# Patient Record
Sex: Female | Born: 1975 | Race: White | Hispanic: No | Marital: Married | Smoking: Former smoker
Health system: Southern US, Community
[De-identification: ages and names within clinical notes are randomized; demographics above are authoritative.]

## PROBLEM LIST (undated history)

## (undated) DIAGNOSIS — F32A Depression, unspecified: Secondary | ICD-10-CM

## (undated) DIAGNOSIS — D649 Anemia, unspecified: Secondary | ICD-10-CM

## (undated) DIAGNOSIS — F419 Anxiety disorder, unspecified: Secondary | ICD-10-CM

## (undated) DIAGNOSIS — F329 Major depressive disorder, single episode, unspecified: Secondary | ICD-10-CM

## (undated) DIAGNOSIS — R519 Headache, unspecified: Secondary | ICD-10-CM

## (undated) DIAGNOSIS — R51 Headache: Secondary | ICD-10-CM

## (undated) DIAGNOSIS — G4700 Insomnia, unspecified: Secondary | ICD-10-CM

## (undated) HISTORY — PX: ABDOMINAL HYSTERECTOMY: SHX81

## (undated) HISTORY — PX: TUBAL LIGATION: SHX77

## (undated) HISTORY — PX: GASTRIC BYPASS: SHX52

## (undated) HISTORY — PX: CHOLECYSTECTOMY: SHX55

## (undated) HISTORY — PX: DILATION AND CURETTAGE OF UTERUS: SHX78

---

## 2007-07-22 ENCOUNTER — Emergency Department: Payer: Self-pay | Admitting: Emergency Medicine

## 2007-07-22 ENCOUNTER — Other Ambulatory Visit: Payer: Self-pay

## 2007-07-24 ENCOUNTER — Ambulatory Visit: Payer: Self-pay | Admitting: Obstetrics and Gynecology

## 2007-07-28 ENCOUNTER — Ambulatory Visit: Payer: Self-pay | Admitting: Obstetrics and Gynecology

## 2008-05-08 ENCOUNTER — Emergency Department: Payer: Self-pay | Admitting: Emergency Medicine

## 2010-03-13 ENCOUNTER — Encounter: Payer: Self-pay | Admitting: Obstetrics & Gynecology

## 2010-04-10 ENCOUNTER — Encounter: Payer: Self-pay | Admitting: Maternal and Fetal Medicine

## 2010-05-15 ENCOUNTER — Inpatient Hospital Stay: Payer: Self-pay | Admitting: Obstetrics and Gynecology

## 2010-06-08 ENCOUNTER — Encounter: Payer: Self-pay | Admitting: Obstetrics and Gynecology

## 2011-02-12 ENCOUNTER — Encounter: Payer: Self-pay | Admitting: Obstetrics and Gynecology

## 2011-03-29 ENCOUNTER — Emergency Department: Payer: Self-pay | Admitting: Emergency Medicine

## 2011-05-29 ENCOUNTER — Observation Stay: Payer: Self-pay | Admitting: Obstetrics and Gynecology

## 2017-08-27 NOTE — H&P (Signed)
HPI: AUB: TVUS wnl- sx anemia requiring iron infusions. Is now on po iron. 3 c/s - minimal adhesions noted at last C/S - op note reviewed. Plan for TLH-->possible TAH.  Workup:  Pap: 8/18 neg with neg HPV  EMBx: ENDOMETRIUM, BIOPSY:  PROLIFERATIVE ENDOMETRIUM. NO HYPERPLASIA OR CARCINOMA.  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  Past Surgical History:  has a past surgical history that includes Gastric bypass; Dilation and curettage of uterus; Cesarean section (2005/2012); Cholecystectomy (02/2004); Tubal ligation (09/10/14); and CESAREAN DELIVERY ONLY; INCLUDING POSTPARTUM CARE (N/A, 09/10/2014). Family History: family history includes Alzheimer's disease in her maternal grandfather and maternal grandmother; Anxiety in her sister; Bipolar disorder in her sister; Coronary Artery Disease (Blocked arteries around heart) in her father; Depression in her sister; Diabetes in her brother and father; Diabetes type II in her father; Genetic problem/disorder in her daughter; High blood pressure (Hypertension) in her father; Hyperlipidemia (Elevated cholesterol) in her father; Obesity in her father and sister; Thyroid disease in her father and mother. Social History:  reports that she quit smoking about 11 years ago. She has a 30.00 pack-year smoking history. She has never used smokeless tobacco. She reports that she does not drink alcohol or use  drugs. OB/GYN History:          OB History    Gravida  5   Para  4   Term  4   Preterm  0   AB  1   Living  4     SAB  1   TAB  0   Ectopic  0   Molar      Multiple  0   Live Births  4          Allergies: is allergic to magnesium sulfate; nsaids (non-steroidal anti-inflammatory drug); bactrim [sulfamethoxazole-trimethoprim]; lexapro [escitalopram oxalate]; paxil [paroxetine hcl]; prozac [fluoxetine]; seroquel [quetiapine]; trazodone; and wellbutrin [bupropion hcl]. Medications:  Current Outpatient Medications:  .  clonazePAM (KLONOPIN) 0.5 MG tablet, Take 1 tablet (0.5 mg total) by mouth once daily as needed for Anxiety., Disp: 30 tablet, Rfl: 2 .  ferrous sulfate 324 mg (65 mg iron) EC tablet, Take 1 tablet (324 mg total) by mouth daily with breakfast., Disp: 30 each, Rfl: 0 .  fluticasone (FLONASE) 50 mcg/actuation nasal spray, Place 2 sprays into both nostrils once daily., Disp: 16 g, Rfl: 2 .  multivitamin (THERAGRAN) tablet, 1 tab by mouth daily, Disp: , Rfl:  .  promethazine (PHENERGAN) 12.5 MG tablet, Take 1-2 tablets every 6 hours as needed., Disp: 30 tablet, Rfl: 1 .  SUMAtriptan (IMITREX) 100 MG tablet, TAKE 1 TABLET BY MOUTH ONCE AS NEEDED FOR MIGRAINE FOR UP TO 1 DOSE. MAY REPEAT ONCE AFTER 2 HOURS, Disp: 9 tablet, Rfl: 11 .  topiramate (TOPAMAX) 50 MG tablet, Take 1 tablet (50 mg total) by mouth 2 (two) times daily., Disp: 60 tablet, Rfl: 11 .  traZODone (DESYREL) 50 MG tablet, Take 1 tablet (50 mg total) by mouth nightly as needed for Sleep., Disp: 90 tablet, Rfl: 3  Review of Systems: No SOB, no palpitations or chest pain, no new lower extremity edema, no nausea or vomiting or bowel or bladder complaints. See HPI for gyn specific ROS.   Exam:   BP 99/71   Pulse 76   Wt 94.2 kg (207 lb 9.6 oz)   BMI 30.66 kg/m   General: Patient is well-groomed, well-nourished, appears stated age in no acute distress  HEENT: head is atraumatic  and normocephalic, trachea is midline, neck is supple with no palpable nodules  CV: Regular rhythm and normal heart rate, no murmur  Pulm: Clear to auscultation throughout lung fields with no wheezing, crackles, or rhonchi. No increased work of breathing  Abdomen: soft , no mass, non-tender, no rebound tenderness, no hepatomegaly  Pelvic: tanner stage 5 ,              External genitalia: vulva /labia no lesions             Urethra: no prolapse             Vagina: normal physiologic d/c, laxity in vaginal walls             Cervix: no lesions, no cervical motion tenderness, good descent             Uterus: normal size shape and contour, non-tender             Adnexa: no mass,  non-tender               Rectovaginal: External wnl   Impression:   The encounter diagnosis was Menorrhagia with irregular cycle.    Plan:    Patient returns for a preoperative discussion regarding her plans to proceed with surgical treatment of her AUB by total laparoscopic hysterectomy with bilateral salpingectomy procedure. Because of her extensive surgical hx, we may need to proceed with TAH.   We will perform a cystoscopy to evaluate the urinary tract after the procedure.   The patient and I discussed the technical aspects of the procedure including the potential for risks and complications. These include but are not limited to the risk of infection requiring post-operative antibiotics or further procedures. We talked about the risk of injury to adjacent organs including bladder, bowel, ureter, blood vessels or nerves. We talked about the need to convert to an open incision. We talked about the possible need for blood transfusion. We talked aboutpostop complications such asthromboembolic or cardiopulmonary complications. All of her questions were answered.  Her preoperative exam was completed and the appropriate consents were signed. She is scheduled to undergo this procedure in the near  future.  Specific Peri-operative Considerations:  - Consent: obtained today - Health Maintenance: up to date - Labs: CBC, CMP preoperatively - Studies: EKG, CXR preoperatively - Bowel Preparation: None required - Abx:  Cefoxitin 2g - VTE ppx: SCDs perioperatively - Glucose Protocol: n/a - Beta-blockade: n/a

## 2017-08-28 ENCOUNTER — Encounter
Admission: RE | Admit: 2017-08-28 | Discharge: 2017-08-28 | Disposition: A | Discharge status: Home / Self Care | Payer: Managed Care, Other (non HMO) | Source: Ambulatory Visit | Attending: Obstetrics and Gynecology | Admitting: Obstetrics and Gynecology

## 2017-08-28 DIAGNOSIS — Z9889 Other specified postprocedural states: Secondary | ICD-10-CM | POA: Insufficient documentation

## 2017-08-28 DIAGNOSIS — Z8349 Family history of other endocrine, nutritional and metabolic diseases: Secondary | ICD-10-CM | POA: Insufficient documentation

## 2017-08-28 DIAGNOSIS — Z886 Allergy status to analgesic agent status: Secondary | ICD-10-CM | POA: Diagnosis not present

## 2017-08-28 DIAGNOSIS — Z79899 Other long term (current) drug therapy: Secondary | ICD-10-CM | POA: Diagnosis not present

## 2017-08-28 DIAGNOSIS — E669 Obesity, unspecified: Secondary | ICD-10-CM | POA: Diagnosis not present

## 2017-08-28 DIAGNOSIS — F329 Major depressive disorder, single episode, unspecified: Secondary | ICD-10-CM | POA: Diagnosis not present

## 2017-08-28 DIAGNOSIS — N920 Excessive and frequent menstruation with regular cycle: Secondary | ICD-10-CM | POA: Diagnosis not present

## 2017-08-28 DIAGNOSIS — Z820 Family history of epilepsy and other diseases of the nervous system: Secondary | ICD-10-CM | POA: Insufficient documentation

## 2017-08-28 DIAGNOSIS — Z818 Family history of other mental and behavioral disorders: Secondary | ICD-10-CM | POA: Diagnosis not present

## 2017-08-28 DIAGNOSIS — D649 Anemia, unspecified: Secondary | ICD-10-CM | POA: Insufficient documentation

## 2017-08-28 DIAGNOSIS — Z87891 Personal history of nicotine dependence: Secondary | ICD-10-CM | POA: Diagnosis not present

## 2017-08-28 DIAGNOSIS — F419 Anxiety disorder, unspecified: Secondary | ICD-10-CM | POA: Diagnosis not present

## 2017-08-28 DIAGNOSIS — Z01812 Encounter for preprocedural laboratory examination: Secondary | ICD-10-CM | POA: Insufficient documentation

## 2017-08-28 DIAGNOSIS — I499 Cardiac arrhythmia, unspecified: Secondary | ICD-10-CM | POA: Diagnosis not present

## 2017-08-28 DIAGNOSIS — Z8249 Family history of ischemic heart disease and other diseases of the circulatory system: Secondary | ICD-10-CM | POA: Diagnosis not present

## 2017-08-28 DIAGNOSIS — Z9049 Acquired absence of other specified parts of digestive tract: Secondary | ICD-10-CM | POA: Insufficient documentation

## 2017-08-28 DIAGNOSIS — Z888 Allergy status to other drugs, medicaments and biological substances status: Secondary | ICD-10-CM | POA: Insufficient documentation

## 2017-08-28 DIAGNOSIS — G4700 Insomnia, unspecified: Secondary | ICD-10-CM | POA: Insufficient documentation

## 2017-08-28 DIAGNOSIS — Z833 Family history of diabetes mellitus: Secondary | ICD-10-CM | POA: Diagnosis not present

## 2017-08-28 DIAGNOSIS — Z9884 Bariatric surgery status: Secondary | ICD-10-CM | POA: Insufficient documentation

## 2017-08-28 HISTORY — DX: Anxiety disorder, unspecified: F41.9

## 2017-08-28 HISTORY — DX: Headache, unspecified: R51.9

## 2017-08-28 HISTORY — DX: Depression, unspecified: F32.A

## 2017-08-28 HISTORY — DX: Insomnia, unspecified: G47.00

## 2017-08-28 HISTORY — DX: Major depressive disorder, single episode, unspecified: F32.9

## 2017-08-28 HISTORY — DX: Headache: R51

## 2017-08-28 HISTORY — DX: Anemia, unspecified: D64.9

## 2017-08-28 LAB — BASIC METABOLIC PANEL
Anion gap: 6 (ref 5–15)
BUN: 10 mg/dL (ref 6–20)
CHLORIDE: 107 mmol/L (ref 101–111)
CO2: 23 mmol/L (ref 22–32)
Calcium: 9 mg/dL (ref 8.9–10.3)
Creatinine, Ser: 0.84 mg/dL (ref 0.44–1.00)
GFR calc Af Amer: >60 mL/min (ref >60)
GLUCOSE: 98 mg/dL (ref 65–99)
POTASSIUM: 3.7 mmol/L (ref 3.5–5.1)
Sodium: 136 mmol/L (ref 135–145)

## 2017-08-28 LAB — CBC
HEMATOCRIT: 34.1 % — AB (ref 35.0–47.0)
HEMOGLOBIN: 10.8 g/dL — AB (ref 12.0–16.0)
MCH: 25.2 pg — AB (ref 26.0–34.0)
MCHC: 31.6 g/dL — AB (ref 32.0–36.0)
MCV: 79.6 fL — AB (ref 80.0–100.0)
Platelets: 265 10*3/uL (ref 150–440)
RBC: 4.29 MIL/uL (ref 3.80–5.20)
RDW: 16.5 % — AB (ref 11.5–14.5)
WBC: 5 10*3/uL (ref 3.6–11.0)

## 2017-08-28 LAB — TYPE AND SCREEN
ABO/RH(D): O POS
ANTIBODY SCREEN: NEGATIVE

## 2017-08-28 LAB — SURGICAL PCR SCREEN
MRSA, PCR: NEGATIVE
Staphylococcus aureus: NEGATIVE

## 2017-08-28 NOTE — Pre-Procedure Instructions (Signed)
Pt here for blood drawn, MRSA nasal swab, pre-op instructions and review incentive spirometry, pt demonstrated correct use of same.

## 2017-08-28 NOTE — Patient Instructions (Signed)
Your procedure is scheduled on: 09/02/17 Report to Day Surgery. [facility] To find out your arrival time please call [phone] between 1PM - 3PM on 08/30/17.  Remember: Instructions that are not followed completely may result in serious medical risk, up to and including death, or upon the discretion of your surgeon and anesthesiologist your surgery may need to be rescheduled.     _X__ 1. Do not eat food after midnight the night before your procedure.                 No gum chewing or hard candies. You may drink clear liquids up to 2 hours                 before you are scheduled to arrive for your surgery- DO not drink clear                 liquids within 2 hours of the start of your surgery.                 Clear Liquids include:  water, apple juice without pulp, clear carbohydrate                 drink such as Clearfast of Gartorade, Black Coffee or Tea (Do not add                 anything to coffee or tea).     _X__ 2.  No Alcohol for 24 hours before or after surgery.   _X__ 3.  Do Not Smoke or use e-cigarettes For 24 Hours Prior to Your Surgery.                 Do not use any chewable tobacco products for at least 6 hours prior to                 surgery.  ____  4.  Bring all medications with you on the day of surgery if instructed.   _X___  5.  Notify your doctor if there is any change in your medical condition      (cold, fever, infections).     Do not wear jewelry, make-up, hairpins, clips or nail polish. Do not wear lotions, powders, or perfumes. You may wear deodorant. Do not shave 48 hours prior to surgery. Men may shave face and neck. Do not bring valuables to the hospital.    [facility] is not responsible for any belongings or valuables.  Contacts, dentures or bridgework may not be worn into surgery. Leave your suitcase in the car. After surgery it may be brought to your room. For patients admitted to the hospital, discharge  time is determined by your treatment team.   Patients discharged the day of surgery will not be allowed to drive home.   Please read over the following fact sheets that you were given:   Surgical Site Infection Prevention / MRSA/ SPIROMETRY  _X___ Take these medicines the morning of surgery with A SIP OF WATER:    1.TOPIRAMATE  2.   3.   4.  5.  6.  ____ Fleet Enema (as directed)   _X___ Use CHG Soap as directed  ____ Use inhalers on the day of surgery  ____ Stop metformin 2 days prior to surgery    ____ Take 1/2 of usual insulin dose the night before surgery. No insulin the morning          of surgery.   ____  Stop Coumadin/Plavix/aspirin on  ____ Stop Anti-inflammatories on    ____ Stop supplements until after surgery.    ____ Bring C-Pap to the hospital.   PRACTICE SPIROMETRY AND BRING DAY OF SURGERY

## 2017-09-01 MED ORDER — CEFAZOLIN SODIUM-DEXTROSE 2-4 GM/100ML-% IV SOLN
2.0000 g | INTRAVENOUS | Status: AC
  Administered 2017-09-02: 2 g via INTRAVENOUS

## 2017-09-02 ENCOUNTER — Encounter
Admission: RE | Disposition: A | Discharge status: Home / Self Care | Payer: Self-pay | Source: Ambulatory Visit | Attending: Obstetrics and Gynecology

## 2017-09-02 ENCOUNTER — Ambulatory Visit
Admission: RE | Admit: 2017-09-02 | Discharge: 2017-09-02 | Disposition: A | Discharge status: Home / Self Care | Payer: Managed Care, Other (non HMO) | Source: Ambulatory Visit | Attending: Obstetrics and Gynecology | Admitting: Obstetrics and Gynecology

## 2017-09-02 ENCOUNTER — Ambulatory Visit: Payer: Managed Care, Other (non HMO) | Admitting: Anesthesiology

## 2017-09-02 DIAGNOSIS — F329 Major depressive disorder, single episode, unspecified: Secondary | ICD-10-CM | POA: Diagnosis not present

## 2017-09-02 DIAGNOSIS — N939 Abnormal uterine and vaginal bleeding, unspecified: Secondary | ICD-10-CM | POA: Insufficient documentation

## 2017-09-02 DIAGNOSIS — Z6829 Body mass index (BMI) 29.0-29.9, adult: Secondary | ICD-10-CM | POA: Diagnosis not present

## 2017-09-02 DIAGNOSIS — D509 Iron deficiency anemia, unspecified: Secondary | ICD-10-CM | POA: Insufficient documentation

## 2017-09-02 DIAGNOSIS — Z87891 Personal history of nicotine dependence: Secondary | ICD-10-CM | POA: Insufficient documentation

## 2017-09-02 DIAGNOSIS — N888 Other specified noninflammatory disorders of cervix uteri: Secondary | ICD-10-CM | POA: Insufficient documentation

## 2017-09-02 DIAGNOSIS — G4700 Insomnia, unspecified: Secondary | ICD-10-CM | POA: Insufficient documentation

## 2017-09-02 DIAGNOSIS — N72 Inflammatory disease of cervix uteri: Secondary | ICD-10-CM | POA: Insufficient documentation

## 2017-09-02 DIAGNOSIS — Z9884 Bariatric surgery status: Secondary | ICD-10-CM | POA: Diagnosis not present

## 2017-09-02 DIAGNOSIS — Z79899 Other long term (current) drug therapy: Secondary | ICD-10-CM | POA: Insufficient documentation

## 2017-09-02 DIAGNOSIS — E669 Obesity, unspecified: Secondary | ICD-10-CM | POA: Insufficient documentation

## 2017-09-02 DIAGNOSIS — F419 Anxiety disorder, unspecified: Secondary | ICD-10-CM | POA: Diagnosis not present

## 2017-09-02 HISTORY — PX: LAPAROSCOPIC BILATERAL SALPINGECTOMY: SHX5889

## 2017-09-02 HISTORY — PX: LAPAROSCOPIC HYSTERECTOMY: SHX1926

## 2017-09-02 HISTORY — PX: CYSTOSCOPY: SHX5120

## 2017-09-02 LAB — POCT PREGNANCY, URINE: PREG TEST UR: NEGATIVE

## 2017-09-02 LAB — ABO/RH: ABO/RH(D): O POS

## 2017-09-02 SURGERY — HYSTERECTOMY, TOTAL, LAPAROSCOPIC
Anesthesia: General

## 2017-09-02 MED ORDER — DOCUSATE SODIUM 100 MG PO CAPS: 100.0000 mg | ORAL_CAPSULE | Freq: Two times a day (BID) | ORAL | 0 refills | Status: AC

## 2017-09-02 MED ORDER — FENTANYL CITRATE (PF) 100 MCG/2ML IJ SOLN
INTRAMUSCULAR | Status: AC
  Filled 2017-09-02: qty 2

## 2017-09-02 MED ORDER — FENTANYL CITRATE (PF) 100 MCG/2ML IJ SOLN
INTRAMUSCULAR | Status: DC | PRN
  Administered 2017-09-02 (×2): 50 ug via INTRAVENOUS
  Administered 2017-09-02: 100 ug via INTRAVENOUS

## 2017-09-02 MED ORDER — ONDANSETRON HCL 4 MG/2ML IJ SOLN
INTRAMUSCULAR | Status: AC
  Filled 2017-09-02: qty 2

## 2017-09-02 MED ORDER — FENTANYL CITRATE (PF) 100 MCG/2ML IJ SOLN
25.0000 ug | INTRAMUSCULAR | Status: DC | PRN
  Administered 2017-09-02 (×2): 25 ug via INTRAVENOUS

## 2017-09-02 MED ORDER — BUPIVACAINE HCL 0.5 % IJ SOLN
INTRAMUSCULAR | Status: DC | PRN
  Administered 2017-09-02: 17 mL

## 2017-09-02 MED ORDER — MIDAZOLAM HCL 2 MG/2ML IJ SOLN
INTRAMUSCULAR | Status: AC
  Filled 2017-09-02: qty 2

## 2017-09-02 MED ORDER — PROPOFOL 10 MG/ML IV BOLUS
INTRAVENOUS | Status: DC | PRN
  Administered 2017-09-02: 160 mg via INTRAVENOUS

## 2017-09-02 MED ORDER — DEXAMETHASONE SODIUM PHOSPHATE 10 MG/ML IJ SOLN
INTRAMUSCULAR | Status: AC
  Filled 2017-09-02: qty 1

## 2017-09-02 MED ORDER — METHYLENE BLUE 0.5 % INJ SOLN
INTRAVENOUS | Status: AC
  Filled 2017-09-02: qty 10

## 2017-09-02 MED ORDER — ACETAMINOPHEN 500 MG PO TABS
ORAL_TABLET | ORAL | Status: AC
  Administered 2017-09-02: 1000 mg via ORAL
  Filled 2017-09-02: qty 2

## 2017-09-02 MED ORDER — ROCURONIUM BROMIDE 100 MG/10ML IV SOLN
INTRAVENOUS | Status: DC | PRN
  Administered 2017-09-02: 10 mg via INTRAVENOUS
  Administered 2017-09-02: 50 mg via INTRAVENOUS
  Administered 2017-09-02: 20 mg via INTRAVENOUS
  Administered 2017-09-02: 30 mg via INTRAVENOUS

## 2017-09-02 MED ORDER — ACETAMINOPHEN 500 MG PO TABS
1000.0000 mg | ORAL_TABLET | ORAL | Status: AC
  Administered 2017-09-02: 1000 mg via ORAL

## 2017-09-02 MED ORDER — GABAPENTIN 400 MG PO CAPS
ORAL_CAPSULE | ORAL | Status: AC
  Administered 2017-09-02: 800 mg via ORAL
  Filled 2017-09-02: qty 2

## 2017-09-02 MED ORDER — SEVOFLURANE IN SOLN
RESPIRATORY_TRACT | Status: AC
  Filled 2017-09-02: qty 250

## 2017-09-02 MED ORDER — LACTATED RINGERS IR SOLN
Status: DC | PRN
  Administered 2017-09-02: 250 mL

## 2017-09-02 MED ORDER — CEFAZOLIN SODIUM-DEXTROSE 2-4 GM/100ML-% IV SOLN
INTRAVENOUS | Status: AC
  Filled 2017-09-02: qty 100

## 2017-09-02 MED ORDER — BUPIVACAINE HCL (PF) 0.5 % IJ SOLN
INTRAMUSCULAR | Status: AC
Start: 2017-09-02 — End: ?
  Filled 2017-09-02: qty 30

## 2017-09-02 MED ORDER — ONDANSETRON HCL 4 MG/2ML IJ SOLN
4.0000 mg | Freq: Once | INTRAMUSCULAR | Status: AC | PRN
  Administered 2017-09-02: 4 mg via INTRAVENOUS

## 2017-09-02 MED ORDER — PROPOFOL 10 MG/ML IV BOLUS
INTRAVENOUS | Status: AC
  Filled 2017-09-02: qty 20

## 2017-09-02 MED ORDER — LACTATED RINGERS IV SOLN
INTRAVENOUS | Status: DC
  Administered 2017-09-02 (×3): via INTRAVENOUS

## 2017-09-02 MED ORDER — EPHEDRINE SULFATE 50 MG/ML IJ SOLN
INTRAMUSCULAR | Status: DC | PRN
Start: 2017-09-02 — End: 2017-09-02
  Administered 2017-09-02 (×2): 20 mg via INTRAVENOUS

## 2017-09-02 MED ORDER — ONDANSETRON HCL 4 MG/2ML IJ SOLN
INTRAMUSCULAR | Status: DC | PRN
  Administered 2017-09-02: 4 mg via INTRAVENOUS

## 2017-09-02 MED ORDER — LACTATED RINGERS IV SOLN: INTRAVENOUS | Status: DC

## 2017-09-02 MED ORDER — OXYCODONE HCL 5 MG PO TABS
ORAL_TABLET | ORAL | Status: AC
  Filled 2017-09-02: qty 1

## 2017-09-02 MED ORDER — ROCURONIUM BROMIDE 50 MG/5ML IV SOLN
INTRAVENOUS | Status: AC
  Filled 2017-09-02: qty 1

## 2017-09-02 MED ORDER — SUGAMMADEX SODIUM 500 MG/5ML IV SOLN
INTRAVENOUS | Status: DC | PRN
  Administered 2017-09-02: 200 mg via INTRAVENOUS

## 2017-09-02 MED ORDER — ACETAMINOPHEN 500 MG PO TABS: 1000.0000 mg | ORAL_TABLET | Freq: Four times a day (QID) | ORAL | 0 refills | Status: AC

## 2017-09-02 MED ORDER — OXYCODONE HCL 5 MG PO TABS
5.0000 mg | ORAL_TABLET | Freq: Four times a day (QID) | ORAL | Status: DC | PRN
  Administered 2017-09-02: 5 mg via ORAL
  Filled 2017-09-02 (×2): qty 1

## 2017-09-02 MED ORDER — LIDOCAINE HCL (CARDIAC) 20 MG/ML IV SOLN
INTRAVENOUS | Status: DC | PRN
  Administered 2017-09-02: 50 mg via INTRAVENOUS

## 2017-09-02 MED ORDER — FENTANYL CITRATE (PF) 100 MCG/2ML IJ SOLN
INTRAMUSCULAR | Status: AC
  Administered 2017-09-02: 25 ug via INTRAVENOUS
  Filled 2017-09-02: qty 2

## 2017-09-02 MED ORDER — FAMOTIDINE 20 MG PO TABS
20.0000 mg | ORAL_TABLET | Freq: Once | ORAL | Status: AC
  Administered 2017-09-02: 20 mg via ORAL

## 2017-09-02 MED ORDER — MIDAZOLAM HCL 2 MG/2ML IJ SOLN
INTRAMUSCULAR | Status: DC | PRN
  Administered 2017-09-02: 2 mg via INTRAVENOUS

## 2017-09-02 MED ORDER — GABAPENTIN 800 MG PO TABS: 800.0000 mg | ORAL_TABLET | Freq: Every day | ORAL | 0 refills | Status: AC

## 2017-09-02 MED ORDER — OXYCODONE HCL 5 MG PO CAPS: 5.0000 mg | ORAL_CAPSULE | Freq: Four times a day (QID) | ORAL | 0 refills | Status: AC | PRN

## 2017-09-02 MED ORDER — GABAPENTIN 300 MG PO CAPS
900.0000 mg | ORAL_CAPSULE | ORAL | Status: AC
Start: 2017-09-02 — End: 2017-09-02
  Administered 2017-09-02: 800 mg via ORAL

## 2017-09-02 MED ORDER — FAMOTIDINE 20 MG PO TABS
ORAL_TABLET | ORAL | Status: AC
  Administered 2017-09-02: 20 mg via ORAL
  Filled 2017-09-02: qty 1

## 2017-09-02 MED ORDER — DEXAMETHASONE SODIUM PHOSPHATE 10 MG/ML IJ SOLN
INTRAMUSCULAR | Status: DC | PRN
  Administered 2017-09-02: 10 mg via INTRAVENOUS

## 2017-09-02 SURGICAL SUPPLY — 65 items
BAG URO DRAIN 2000ML W/SPOUT (MISCELLANEOUS) ×4 IMPLANT
BLADE SURG SZ11 CARB STEEL (BLADE) ×4 IMPLANT
CATH FOLEY 2WAY  5CC 16FR (CATHETERS) ×2
CATH ROBINSON RED A/P 16FR (CATHETERS) ×4 IMPLANT
CATH URTH 16FR FL 2W BLN LF (CATHETERS) ×2 IMPLANT
CHLORAPREP W/TINT 26ML (MISCELLANEOUS) ×4 IMPLANT
CLOSURE WOUND 1/4X4 (GAUZE/BANDAGES/DRESSINGS) ×1
CORD MONOPOLAR M/FML 12FT (MISCELLANEOUS) ×4 IMPLANT
COUNTER NEEDLE 20/40 LG (NEEDLE) ×4 IMPLANT
COVER LIGHT HANDLE STERIS (MISCELLANEOUS) ×8 IMPLANT
DEFOGGER SCOPE WARMER CLEARIFY (MISCELLANEOUS) ×4 IMPLANT
DERMABOND ADVANCED (GAUZE/BANDAGES/DRESSINGS) ×2
DERMABOND ADVANCED .7 DNX12 (GAUZE/BANDAGES/DRESSINGS) ×2 IMPLANT
DEVICE SUTURE ENDOST 10MM (ENDOMECHANICALS) ×4 IMPLANT
DRSG TEGADERM 2-3/8X2-3/4 SM (GAUZE/BANDAGES/DRESSINGS) ×12 IMPLANT
GAUZE SPONGE NON-WVN 2X2 STRL (MISCELLANEOUS) ×4 IMPLANT
GLOVE BIO SURGEON STRL SZ7 (GLOVE) ×8 IMPLANT
GLOVE INDICATOR 7.5 STRL GRN (GLOVE) ×4 IMPLANT
GOWN STRL REUS W/ TWL LRG LVL3 (GOWN DISPOSABLE) ×4 IMPLANT
GOWN STRL REUS W/ TWL XL LVL3 (GOWN DISPOSABLE) ×2 IMPLANT
GOWN STRL REUS W/TWL LRG LVL3 (GOWN DISPOSABLE) ×4
GOWN STRL REUS W/TWL XL LVL3 (GOWN DISPOSABLE) ×2
GRASPER SUT TROCAR 14GX15 (MISCELLANEOUS) ×4 IMPLANT
IRRIGATION STRYKERFLOW (MISCELLANEOUS) ×4 IMPLANT
IRRIGATOR STRYKERFLOW (MISCELLANEOUS) ×8
IV LACTATED RINGERS 1000ML (IV SOLUTION) ×4 IMPLANT
IV NS 1000ML (IV SOLUTION) ×2
IV NS 1000ML BAXH (IV SOLUTION) ×2 IMPLANT
KIT PINK PAD W/HEAD ARE REST (MISCELLANEOUS) ×4
KIT PINK PAD W/HEAD ARM REST (MISCELLANEOUS) ×2 IMPLANT
KIT RM TURNOVER CYSTO AR (KITS) ×4 IMPLANT
LABEL OR SOLS (LABEL) ×4 IMPLANT
LIGASURE BLUNT 5MM 37CM (INSTRUMENTS) ×4 IMPLANT
MANIPULATOR VCARE LG CRV RETR (MISCELLANEOUS) IMPLANT
MANIPULATOR VCARE SML CRV RETR (MISCELLANEOUS) ×4 IMPLANT
MANIPULATOR VCARE STD CRV RETR (MISCELLANEOUS) IMPLANT
NEEDLE VERESS 14GA 120MM (NEEDLE) ×4 IMPLANT
NS IRRIG 500ML POUR BTL (IV SOLUTION) ×4 IMPLANT
OCCLUDER COLPOPNEUMO (BALLOONS) ×8 IMPLANT
PACK GYN LAPAROSCOPIC (MISCELLANEOUS) ×4 IMPLANT
PAD OB MATERNITY 4.3X12.25 (PERSONAL CARE ITEMS) ×4 IMPLANT
PAD PREP 24X41 OB/GYN DISP (PERSONAL CARE ITEMS) ×4 IMPLANT
POUCH SPECIMEN RETRIEVAL 10MM (ENDOMECHANICALS) IMPLANT
SCISSORS METZENBAUM CVD 33 (INSTRUMENTS) ×4 IMPLANT
SET CYSTO W/LG BORE CLAMP LF (SET/KITS/TRAYS/PACK) ×4 IMPLANT
SLEEVE ENDOPATH XCEL 5M (ENDOMECHANICALS) ×8 IMPLANT
SPONGE VERSALON 2X2 STRL (MISCELLANEOUS) ×4
STRIP CLOSURE SKIN 1/4X4 (GAUZE/BANDAGES/DRESSINGS) ×3 IMPLANT
SURGILUBE 2OZ TUBE FLIPTOP (MISCELLANEOUS) ×4 IMPLANT
SUT ENDO VLOC 180-0-8IN (SUTURE) ×4 IMPLANT
SUT MNCRL 4-0 (SUTURE) ×2
SUT MNCRL 4-0 27XMFL (SUTURE) ×2
SUT MNCRL AB 4-0 PS2 18 (SUTURE) ×4 IMPLANT
SUT VIC AB 0 CT1 36 (SUTURE) ×8 IMPLANT
SUT VIC AB 2-0 UR6 27 (SUTURE) ×4 IMPLANT
SUT VIC AB 4-0 SH 27 (SUTURE) ×2
SUT VIC AB 4-0 SH 27XANBCTRL (SUTURE) ×2 IMPLANT
SUTURE MNCRL 4-0 27XMF (SUTURE) ×2 IMPLANT
SWABSTK COMLB BENZOIN TINCTURE (MISCELLANEOUS) ×4 IMPLANT
SYR 50ML LL SCALE MARK (SYRINGE) ×4 IMPLANT
SYRINGE 10CC LL (SYRINGE) ×4 IMPLANT
TROCAR ENDO BLADELESS 11MM (ENDOMECHANICALS) ×4 IMPLANT
TROCAR XCEL NON-BLD 5MMX100MML (ENDOMECHANICALS) ×8 IMPLANT
TUBING INSUF HEATED (TUBING) ×4 IMPLANT
TUBING INSUFFLATOR HI FLOW (MISCELLANEOUS) ×4 IMPLANT

## 2017-09-02 NOTE — Discharge Instructions (Signed)
Discharge instructions after  ° total laparoscopic hysterectomy ° °Signs and Symptoms to Report °Call our office at [phone] if you have any of the following. ° °• Fever over 100.4 degrees or higher °• Severe stomach pain not relieved with pain medications °• Bright red bleeding that’s heavier than a period that does not slow with rest °• To go the bathroom a lot (frequency), you can’t hold your urine (urgency), or it hurts when you empty your bladder (urinate) °• Chest pain °• Shortness of breath °• Pain in the calves of your legs °• Severe nausea and vomiting not relieved with anti-nausea medications °• Signs of infection around your wounds, such as redness, hot to touch, swelling, green/yellow drainage (like pus), bad smelling discharge °• Any concerns ° °What You Can Expect after Surgery °• You may see some pink tinged, bloody fluid and bruising around the wound. This is normal. °• You may notice shoulder and neck pain. This is caused by the gas used during surgery to expand your abdomen so your surgeon could get to the uterus easier. °• You may have a sore throat because of the tube in your mouth during general anesthesia. This will go away in 2 to 3 days. °• You may have some stomach cramps. °• You may notice spotting on your panties. °• You may have pain around the incision sites. ° ° °Activities after Your Discharge °Follow these guidelines to help speed your recovery at home: °• Do the coughing and deep breathing as you did in the hospital for 2 weeks. Use the small blue breathing device, called the incentive spirometer for 2 weeks. °• Don’t drive if you are in pain or taking narcotic pain medicine. You may drive when you can safely slam on the brakes, turn the wheel forcefully, and rotate your torso comfortably. This is typically 1-2 weeks. Practice in a parking lot or side street prior to attempting to drive regularly.  °• Ask others to help with household chores for 4 weeks. °• Do not lift anything  heavier that 10 pounds for 4-6 weeks. This includes pets, children, and groceries. °• Don’t do strenuous activities, exercises, or sports like vacuuming, tennis, squash, etc. until your doctor says it is safe to do so. °---Maintain pelvic rest for 8 weeks. This means nothing in the vagina or rectum at all (no douching, tampons, intercourse) for 8 weeks.  °• Walk as you feel able. Rest often since it may take two or three weeks for your energy level to return to normal.  °• You may climb stairs °• Avoid constipation: °  -Eat fruits, vegetables, and whole grains. Eat small meals as your appetite will take time to return to normal. °  -Drink 6 to 8 glasses of water each day unless your doctor has told you to limit your fluids. °  -Use a laxative or stool softener as needed if constipation becomes a problem. You may take Miralax, metamucil, Citrucil, Colace, Senekot, FiberCon, etc. If this does not relieve the constipation, try two tablespoons of Milk Of Magnesia every 8 hours until your bowels move.  °• You may shower. Gently wash the wounds with a mild soap and water. Pat dry. °• Do not get in a hot tub, swimming pool, etc. for 6 weeks. °• Do not use lotions, oils, powders on the wounds. °• Do not douche, use tampons, or have sex until your doctor says it is okay. °• Take your pain medicine when you need it. The medicine may   not work as well if the pain is bad. ° °Take the medicines you were taking before surgery. Other medications you will need are pain medications and possibly constipation and nausea medications (Zofran). AMBULATORY SURGERY  °DISCHARGE INSTRUCTIONS ° ° °1) The drugs that you were given will stay in your system until tomorrow so for the next 24 hours you should not: ° °A) Drive an automobile °B) Make any legal decisions °C) Drink any alcoholic beverage ° ° °2) You may resume regular meals tomorrow.  Today it is better to start with liquids and gradually work up to solid foods. ° °You may eat anything  you prefer, but it is better to start with liquids, then soup and crackers, and gradually work up to solid foods. ° ° °3) Please notify your doctor immediately if you have any unusual bleeding, trouble breathing, redness and pain at the surgery site, drainage, fever, or pain not relieved by medication. ° ° ° °4) Additional Instructions: ° ° ° ° ° ° ° °Please contact your physician with any problems or Same Day Surgery at [phone], Monday through Friday 6 am to 4 pm, or [place] [facility] [place] Main number at [phone]. °

## 2017-09-02 NOTE — Op Note (Signed)
[place] PROCEDURE DATE: 09/02/2017  PREOPERATIVE DIAGNOSIS: Abnormal uterine bleeding POSTOPERATIVE DIAGNOSIS: The same PROCEDURE: Total laparoscopic hysterectomy, bilateral salpingectomy, cystoscopy SURGEON:  Dr. Christeen Douglas ASSISTANT: Dr. Jennell Corner Anesthesiologist:  Anesthesiologist: Naomie Dean, MD; Darleene Cleaver, Gerrit Heck, MD CRNA: Eduardo Osier, CRNA; Mathews Argyle, CRNA  INDICATIONS: 41 y.o. here for definitive surgical management secondary to the indications listed under preoperative diagnoses; please see preoperative note for further details.  Risks of surgery were discussed with the patient including but not limited to: bleeding which may require transfusion or reoperation; infection which may require antibiotics; injury to bowel, bladder, ureters or other surrounding organs; need for additional procedures; thromboembolic phenomenon, incisional problems and other postoperative/anesthesia complications. Written informed consent was obtained.    FINDINGS:  Small, normal uterus tubes and bilateral ovaries. No upper abdominal abnormalities, other than minimal adhesions.   ANESTHESIA:    General INTRAVENOUS FLUIDS:1700  ml ESTIMATED BLOOD LOSS:75 ml URINE OUTPUT: 300 ml   SPECIMENS: Uterus, cervix, bilateral fallopian tubes  COMPLICATIONS: None immediate  PROCEDURE IN DETAIL:  The patient received prophalactic intravenous antibiotics and had sequential compression devices applied to her lower extremities while in the preoperative area.  She was then taken to the operating room where general anesthesia was administered and was found to be adequate.  She was placed in the dorsal lithotomy position, and was prepped and draped in a sterile manner.  A formal time out was performed with all team members present and in agreement.  A V-care uterine manipulator was placed at this time.  A Foley catheter was inserted into her bladder and attached to constant  drainage. Attention was turned to the abdomen and 0.5% Marcaine infused subq. A 5mm umbilical incision was made with the scalpel.  The Optiview 5-mm trocar and sleeve were then advanced without difficulty with the laparoscope under direct visualization into the abdomen.  The abdomen was then insufflated with carbon dioxide gas and adequate pneumoperitoneum was obtained.  A survey of the patient's pelvis and abdomen revealed the findings above.  Bilateral lower quadrant ports (5 mm on the right and 5 mm on the left) were then placed under direct visualization.  The pelvis was then carefully examined.  Attention was turned to the fallopian tubes; these were freed from the underlying mesosalpinx and the uterine attachments using the Ligasure device.  The bilateral round and broad ligaments were then clamped and transected with the Ligasure device.  The uterine artery was then skeletonized and a bladder flap was created.  The ureters were noted to be safely away from the area of dissection.  The bladder was then bluntly dissected off the lower uterine segment.    At this point, attention was turned to the uterine vessels, which were clamped and cauterized using the Ligasure on the left, and then the right. After the uterine blood flow at the level of the internal os was controlled, both arteries were cut with the Ligasure.  Good hemostasis was noted overall.  The uterosacral and cardinal ligaments were clamped, cut and ligated bilaterally .  Attention was then turned to the cervicovaginal junction, and monopolar scissors were used to transect the cervix from the surrounding vagina using the ring of the V-care as a guide. This was done circumferentially allowing total hysterectomy.  The uterus was then removed from the vagina, a 11mm port placed in the LLQ, and the colpotomy closed with Endostitch.  Overall excellent hemostasis was noted.    Attention was returned to the  abdomen.The ureters were reexamined  bilaterally and were pulsating normally. The abdominal pressure was reduced and hemostasis was confirmed.   Cystoscopy showed bilateral ureteral jets.  No stitches were visualized in the bladder during cystoscopy.  All trocars were removed under direct visualization, and the abdomen was desufflated.  All skin incisions were closed with 4-0 Vicryl subcuticular stitches and Dermabond. The patient tolerated the procedures well.  All instruments, needles, and sponge counts were correct x 2. The patient was taken to the recovery room awake, extubated and in stable condition.   Plan for same day discharge.

## 2017-09-02 NOTE — Anesthesia Preprocedure Evaluation (Signed)
Anesthesia Evaluation  Patient identified by MRN, date of birth, ID band Patient awake    Reviewed: Allergy & Precautions, NPO status , Patient's Chart, lab work & pertinent test results  Airway Mallampati: II       Dental  (+) Teeth Intact   Pulmonary former smoker,    breath sounds clear to auscultation       Cardiovascular Exercise Tolerance: Good  Rhythm:Regular Rate:Normal     Neuro/Psych Anxiety Depression    GI/Hepatic negative GI ROS, Neg liver ROS,   Endo/Other  Morbid obesity  Renal/GU negative Renal ROS     Musculoskeletal   Abdominal   Peds negative pediatric ROS (+)  Hematology  (+) anemia ,   Anesthesia Other Findings   Reproductive/Obstetrics                             Anesthesia Physical Anesthesia Plan  ASA: II  Anesthesia Plan: General   Post-op Pain Management:    Induction: Intravenous  PONV Risk Score and Plan: 1 and Ondansetron and Dexamethasone  Airway Management Planned: Oral ETT  Additional Equipment:   Intra-op Plan:   Post-operative Plan: Extubation in OR  Informed Consent: I have reviewed the patients History and Physical, chart, labs and discussed the procedure including the risks, benefits and alternatives for the proposed anesthesia with the patient or authorized representative who has indicated his/her understanding and acceptance.     Plan Discussed with: CRNA  Anesthesia Plan Comments:         Anesthesia Quick Evaluation

## 2017-09-02 NOTE — Anesthesia Procedure Notes (Signed)
Procedure Name: Intubation Date/Time: 09/02/2017 10:40 AM Performed by: UMPNTIR, Rashaun Curl Pre-anesthesia Checklist: Timeout performed, Patient being monitored, Suction available, Emergency Drugs available and Patient identified Patient Re-evaluated:Patient Re-evaluated prior to induction Oxygen Delivery Method: Circle system utilized Preoxygenation: Pre-oxygenation with 100% oxygen Induction Type: IV induction Ventilation: Mask ventilation without difficulty Laryngoscope Size: Mac and 3 Grade View: Grade I Tube type: Oral Tube size: 7.0 mm Number of attempts: 1 Airway Equipment and Method: Stylet Placement Confirmation: ETT inserted through vocal cords under direct vision,  positive ETCO2,  breath sounds checked- equal and bilateral and CO2 detector Secured at: 21 cm Tube secured with: Tape

## 2017-09-02 NOTE — Interval H&P Note (Signed)
History and Physical Interval Note:  09/02/2017 10:11 AM  Kara Martin  has presented today for surgery, with the diagnosis of AUB   The various methods of treatment have been discussed with the patient and family. After consideration of risks, benefits and other options for treatment, the patient has consented to  Procedure(s): HYSTERECTOMY TOTAL LAPAROSCOPIC (N/A) LAPAROSCOPIC BILATERAL SALPINGECTOMY (Bilateral) CYSTOSCOPY (N/A) HYSTERECTOMY ABDOMINAL WITH SALPINGECTOMY (Bilateral) (Possible) as a surgical intervention .  The patient's history has been reviewed, patient examined, no change in status, stable for surgery.  I have reviewed the patient's chart and labs.  Questions were answered to the patient's satisfaction.     Christeen DouglasBethany Maguire Sime

## 2017-09-02 NOTE — Transfer of Care (Signed)
Immediate Anesthesia Transfer of Care Note  Patient: Kara Martin R Arenz  Procedure(s) Performed: HYSTERECTOMY TOTAL LAPAROSCOPIC (N/A ) LAPAROSCOPIC BILATERAL SALPINGECTOMY (Bilateral ) CYSTOSCOPY (N/A )  Patient Location: PACU  Anesthesia Type:General  Level of Consciousness: awake, alert  and oriented  Airway & Oxygen Therapy: Patient Spontanous Breathing and Patient connected to face mask oxygen  Post-op Assessment: Report given to RN  Post vital signs: Reviewed and stable  Last Vitals:  Vitals:   09/02/17 1314 09/02/17 1316  BP: 105/62 105/68  Pulse: 97 96  Resp: 17 17  Temp: (!) 35.8 C (!) 36 C  SpO2: 98% 98%    Last Pain:  Vitals:   09/02/17 0858  TempSrc: Tympanic         Complications: No apparent anesthesia complications

## 2017-09-02 NOTE — Anesthesia Post-op Follow-up Note (Signed)
Anesthesia QCDR form completed.        

## 2017-09-02 NOTE — Anesthesia Postprocedure Evaluation (Signed)
Anesthesia Post Note  Patient: Kara Martin  Procedure(s) Performed: HYSTERECTOMY TOTAL LAPAROSCOPIC (N/A ) LAPAROSCOPIC BILATERAL SALPINGECTOMY (Bilateral ) CYSTOSCOPY (N/A )  Patient location during evaluation: PACU Anesthesia Type: General Level of consciousness: awake and alert Pain management: pain level controlled Vital Signs Assessment: post-procedure vital signs reviewed and stable Respiratory status: spontaneous breathing and respiratory function stable Cardiovascular status: stable Anesthetic complications: no     Last Vitals:  Vitals:   09/02/17 1314 09/02/17 1316  BP: 105/62 105/68  Pulse: 97 96  Resp: 17 17  Temp: (!) 35.8 C (!) 36 C  SpO2: 98% 98%    Last Pain:  Vitals:   09/02/17 0858  TempSrc: Tympanic                 KEPHART,WILLIAM K

## 2017-09-03 ENCOUNTER — Encounter: Payer: Self-pay | Admitting: Obstetrics and Gynecology

## 2017-09-03 LAB — SURGICAL PATHOLOGY

## 2017-09-10 ENCOUNTER — Other Ambulatory Visit: Payer: Self-pay | Admitting: Obstetrics and Gynecology

## 2017-09-10 DIAGNOSIS — G8918 Other acute postprocedural pain: Secondary | ICD-10-CM

## 2017-09-17 ENCOUNTER — Ambulatory Visit: Payer: Managed Care, Other (non HMO)

## 2017-09-17 ENCOUNTER — Ambulatory Visit
Admission: RE | Admit: 2017-09-17 | Discharge: 2017-09-17 | Disposition: A | Discharge status: Home / Self Care | Payer: Managed Care, Other (non HMO) | Source: Ambulatory Visit | Attending: Obstetrics and Gynecology | Admitting: Obstetrics and Gynecology

## 2017-09-17 DIAGNOSIS — K760 Fatty (change of) liver, not elsewhere classified: Secondary | ICD-10-CM | POA: Insufficient documentation

## 2017-09-17 DIAGNOSIS — G8918 Other acute postprocedural pain: Secondary | ICD-10-CM | POA: Insufficient documentation

## 2017-09-17 MED ORDER — IOPAMIDOL (ISOVUE-300) INJECTION 61%
100.0000 mL | Freq: Once | INTRAVENOUS | Status: AC | PRN
  Administered 2017-09-17: 100 mL via INTRAVENOUS

## 2018-02-14 ENCOUNTER — Other Ambulatory Visit: Payer: Self-pay | Admitting: Family Medicine

## 2018-02-14 DIAGNOSIS — Z1231 Encounter for screening mammogram for malignant neoplasm of breast: Secondary | ICD-10-CM

## 2018-05-05 ENCOUNTER — Ambulatory Visit
Admission: RE | Admit: 2018-05-05 | Discharge: 2018-05-05 | Disposition: A | Discharge status: Home / Self Care | Payer: Managed Care, Other (non HMO) | Source: Ambulatory Visit | Attending: Family Medicine | Admitting: Family Medicine

## 2018-05-05 DIAGNOSIS — Z1231 Encounter for screening mammogram for malignant neoplasm of breast: Secondary | ICD-10-CM | POA: Diagnosis present

## 2019-07-14 ENCOUNTER — Other Ambulatory Visit: Payer: Self-pay | Admitting: Family Medicine

## 2019-07-14 DIAGNOSIS — Z1231 Encounter for screening mammogram for malignant neoplasm of breast: Secondary | ICD-10-CM

## 2019-07-22 ENCOUNTER — Ambulatory Visit
Admission: RE | Admit: 2019-07-22 | Discharge: 2019-07-22 | Disposition: A | Discharge status: Home / Self Care | Payer: BC Managed Care – PPO | Source: Ambulatory Visit | Attending: Family Medicine | Admitting: Family Medicine

## 2019-07-22 ENCOUNTER — Other Ambulatory Visit: Payer: Self-pay

## 2019-07-22 DIAGNOSIS — Z1231 Encounter for screening mammogram for malignant neoplasm of breast: Secondary | ICD-10-CM | POA: Diagnosis not present

## 2019-12-18 ENCOUNTER — Ambulatory Visit: Payer: BC Managed Care – PPO | Attending: Internal Medicine

## 2019-12-18 ENCOUNTER — Other Ambulatory Visit: Payer: Self-pay

## 2019-12-18 ENCOUNTER — Ambulatory Visit: Payer: BC Managed Care – PPO

## 2019-12-18 DIAGNOSIS — Z23 Encounter for immunization: Secondary | ICD-10-CM | POA: Insufficient documentation

## 2019-12-18 NOTE — Progress Notes (Signed)
   Covid-19 Vaccination Clinic  Name:  Kara Martin    MRN: [id number] DOB: 1976-08-18  12/18/2019  [name] was observed post Covid-19 immunization for 15 minutes without incidence. She was provided with Vaccine Information Sheet and instruction to access the V-Safe system.   [name] was instructed to call 911 with any severe reactions post vaccine: Marland Kitchen Difficulty breathing  . Swelling of your face and throat  . A fast heartbeat  . A bad rash all over your body  . Dizziness and weakness    Immunizations Administered    Name Date Dose VIS Date Route   Pfizer COVID-19 Vaccine 12/18/2019 12:09 PM 0.3 mL 10/16/2019 Intramuscular   Manufacturer: [employer], [employer]   Lot: [id number]   NDC: [address]-4

## 2020-01-13 ENCOUNTER — Ambulatory Visit: Payer: BC Managed Care – PPO | Attending: Internal Medicine

## 2020-01-13 DIAGNOSIS — Z23 Encounter for immunization: Secondary | ICD-10-CM

## 2020-01-13 NOTE — Progress Notes (Signed)
   [facility] Vaccination [facility]  Name:  Kara Martin    MRN: [id number] DOB: 1975-12-22  01/13/2020  [name] was observed post Covid-19 immunization for 15 minutes without incident. She was provided with Vaccine Information Sheet and instruction to access the V-Safe system.   [name] was instructed to call 911 with any severe reactions post vaccine: Marland Kitchen Difficulty breathing  . Swelling of face and throat  . A fast heartbeat  . A bad rash all over body  . Dizziness and weakness   Immunizations Administered    Name Date Dose VIS Date Route   Pfizer COVID-19 Vaccine 01/13/2020 10:32 AM 0.3 mL 10/16/2019 Intramuscular   Manufacturer: [employer], [employer]   Lot: [id number]   NDC: [address]-7

## 2020-09-19 ENCOUNTER — Other Ambulatory Visit: Payer: Self-pay | Admitting: Family Medicine

## 2020-09-19 DIAGNOSIS — Z1231 Encounter for screening mammogram for malignant neoplasm of breast: Secondary | ICD-10-CM

## 2020-10-24 ENCOUNTER — Ambulatory Visit
Admission: RE | Admit: 2020-10-24 | Discharge: 2020-10-24 | Disposition: A | Discharge status: Home / Self Care | Payer: BC Managed Care – PPO | Source: Ambulatory Visit | Attending: Family Medicine | Admitting: Family Medicine

## 2020-10-24 ENCOUNTER — Other Ambulatory Visit: Payer: Self-pay

## 2020-10-24 DIAGNOSIS — Z1231 Encounter for screening mammogram for malignant neoplasm of breast: Secondary | ICD-10-CM | POA: Diagnosis not present

## 2021-06-03 ENCOUNTER — Ambulatory Visit: Admit: 2021-06-03 | Discharge status: Absent without leave | Payer: BC Managed Care – PPO

## 2021-09-20 ENCOUNTER — Other Ambulatory Visit: Payer: Self-pay | Admitting: Family Medicine

## 2021-09-20 DIAGNOSIS — Z1231 Encounter for screening mammogram for malignant neoplasm of breast: Secondary | ICD-10-CM

## 2021-10-25 ENCOUNTER — Other Ambulatory Visit: Payer: Self-pay

## 2021-10-25 ENCOUNTER — Ambulatory Visit
Admission: RE | Admit: 2021-10-25 | Discharge: 2021-10-25 | Disposition: A | Discharge status: Home / Self Care | Payer: BC Managed Care – PPO | Source: Ambulatory Visit | Attending: Family Medicine | Admitting: Family Medicine

## 2021-10-25 DIAGNOSIS — Z1231 Encounter for screening mammogram for malignant neoplasm of breast: Secondary | ICD-10-CM | POA: Diagnosis not present

## 2022-10-19 LAB — EXTERNAL GENERIC LAB PROCEDURE: COLOGUARD: NEGATIVE

## 2023-11-01 IMAGING — MG MM DIGITAL SCREENING BILAT W/ TOMO AND CAD
8 series · 8 of 24 positions shown · non-contrast
Comparison: Previous exam(s).

CLINICAL DATA: Screening.

EXAM:
DIGITAL SCREENING BILATERAL MAMMOGRAM WITH TOMOSYNTHESIS AND CAD
TECHNIQUE: Bilateral screening digital craniocaudal and mediolateral oblique
mammograms were obtained. Bilateral screening digital breast
tomosynthesis was performed. The images were evaluated with
computer-aided detection.

[R MLO synth-2D]
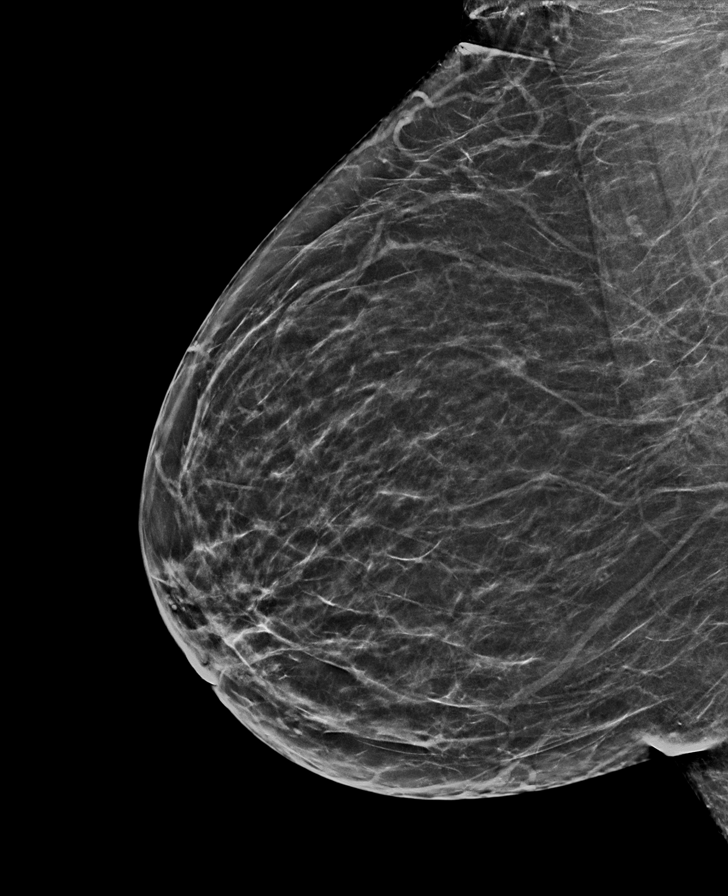

[R CC synth-2D]
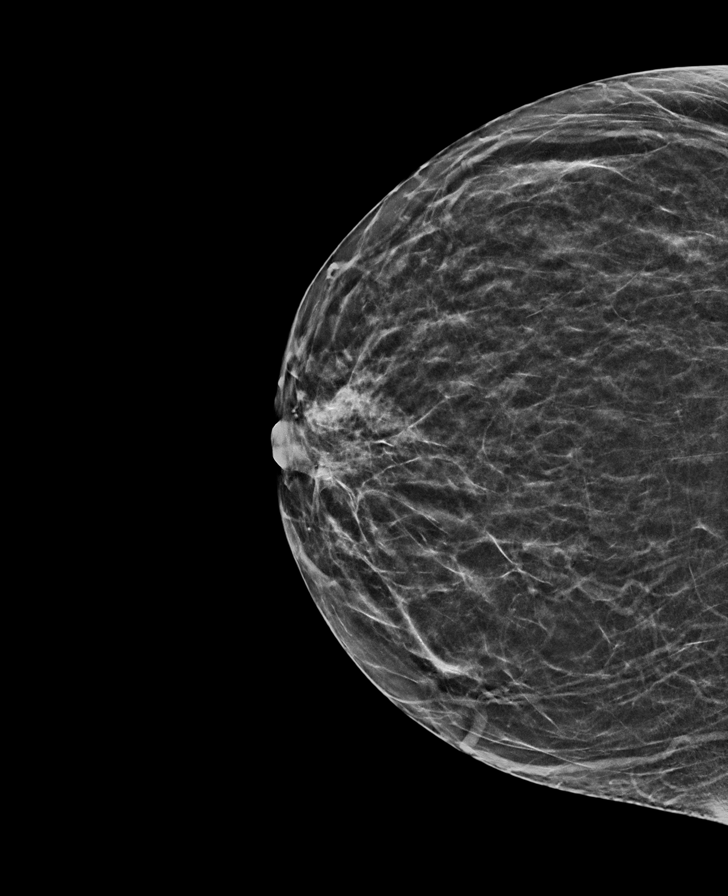

[L CC synth-2D]
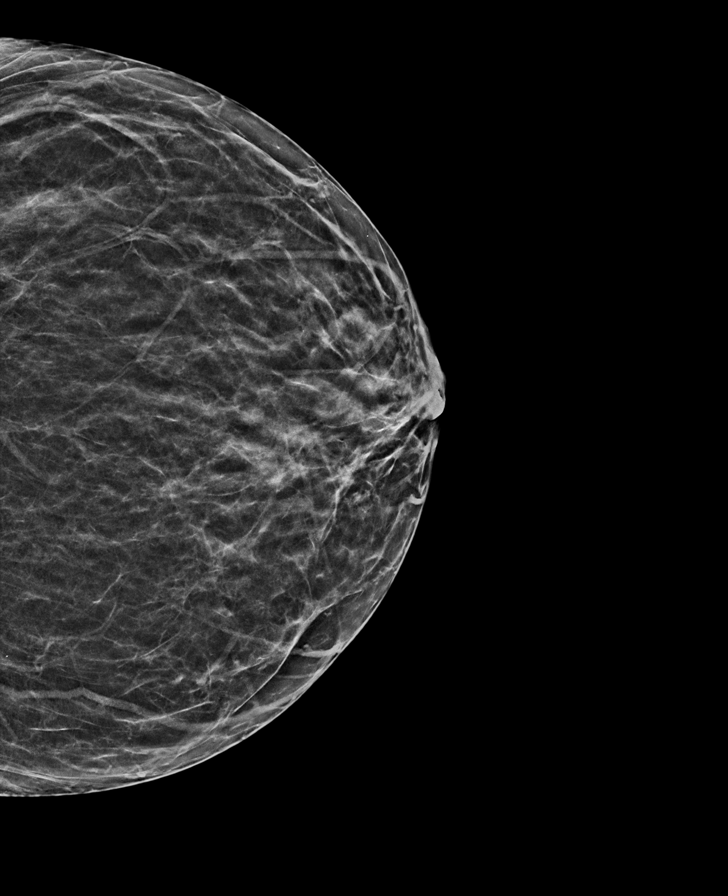

[L MLO synth-2D]
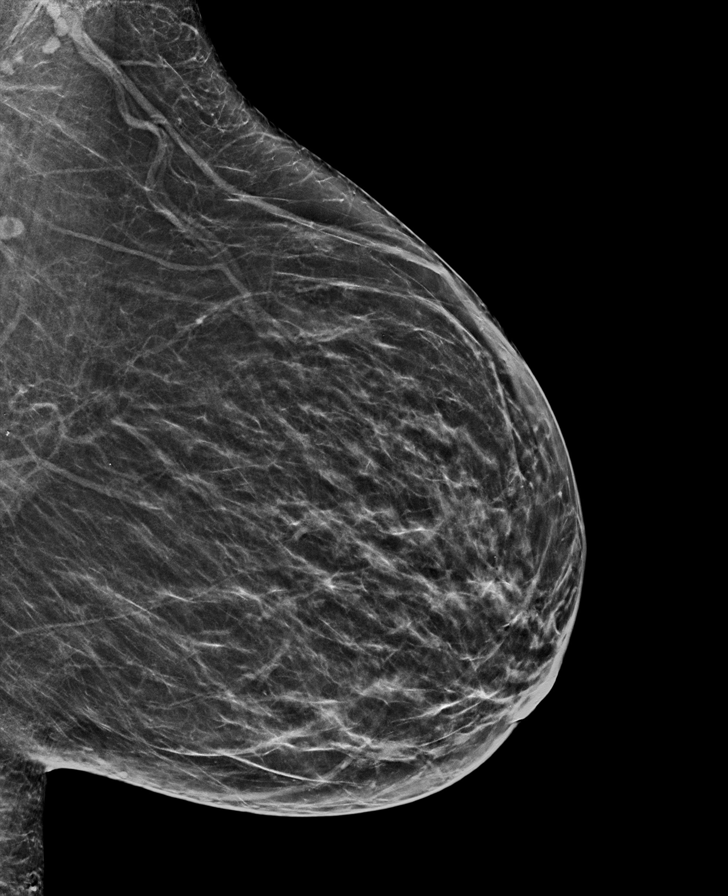

[L MLO tomo · tomo slice 33/65.0]
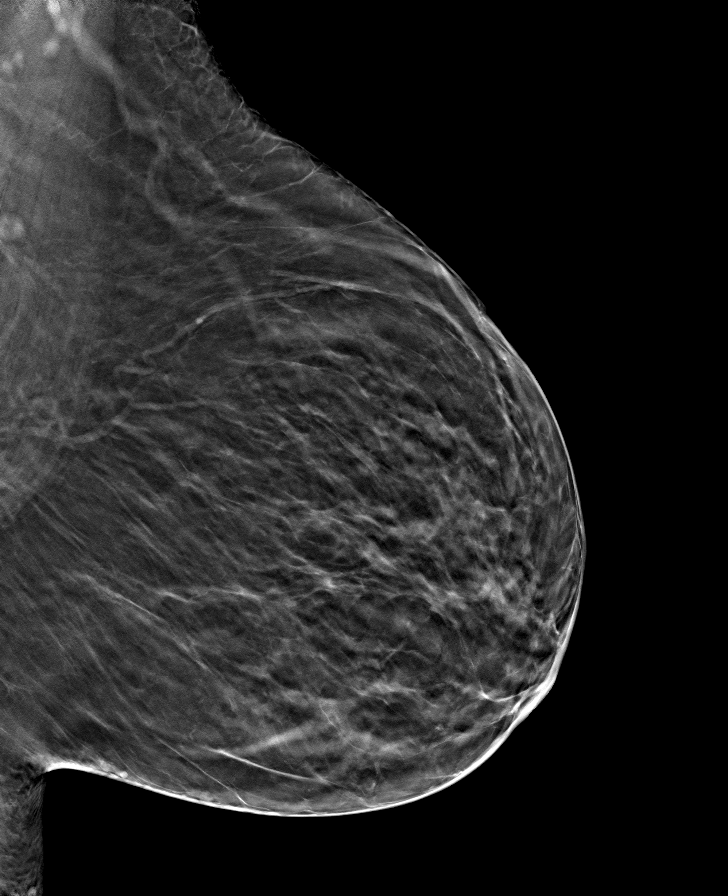

[R CC tomo · tomo slice 27/54.0]
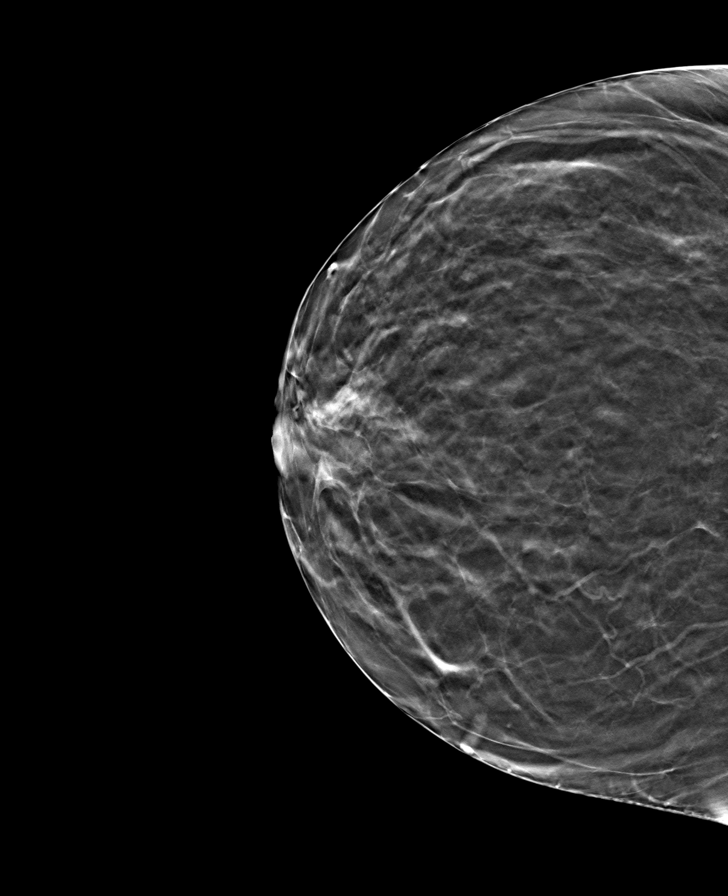

[L CC tomo · tomo slice 27/53.0]
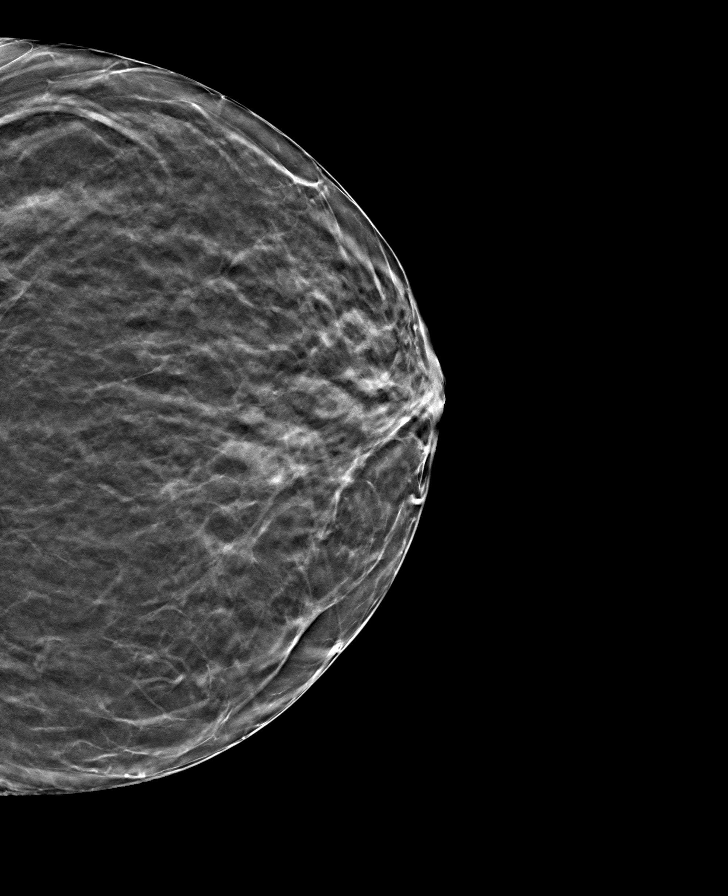

[R MLO tomo · tomo slice 32/63.0]
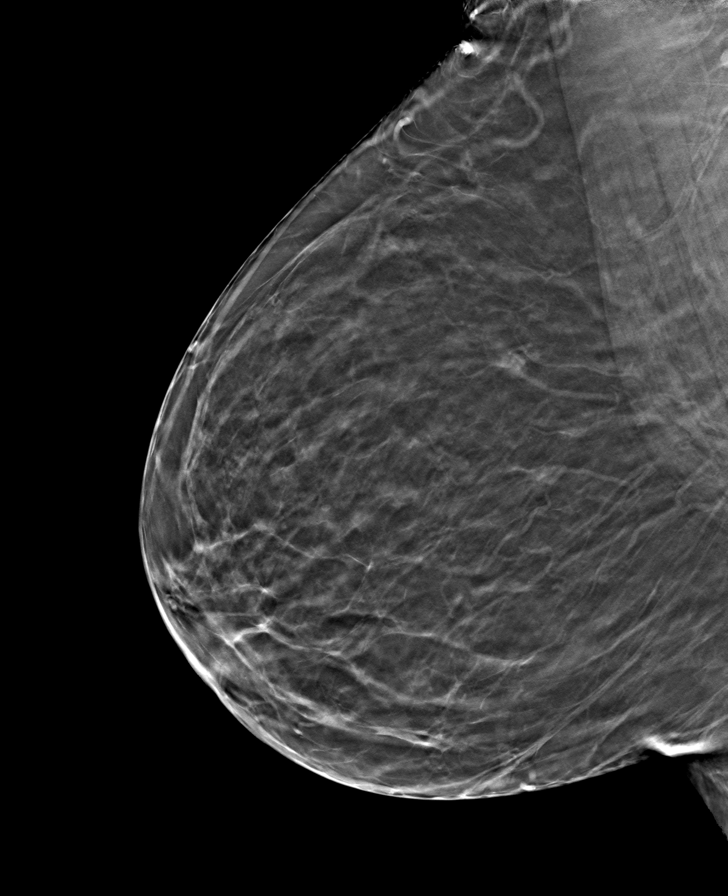

[8 of 24 positions shown; findings below may reference images not displayed]

ACR Breast Density Category b: There are scattered areas of
fibroglandular density.
FINDINGS: There are no findings suspicious for malignancy.
IMPRESSION: No mammographic evidence of malignancy. A result letter of this
screening mammogram will be mailed directly to the patient.

RECOMMENDATION:
Screening mammogram in one year. (Code:51-O-LD2)

BI-RADS CATEGORY  1: Negative.

## 2023-12-23 ENCOUNTER — Other Ambulatory Visit: Payer: Self-pay | Admitting: Family Medicine

## 2023-12-23 DIAGNOSIS — Z1231 Encounter for screening mammogram for malignant neoplasm of breast: Secondary | ICD-10-CM

## 2023-12-31 ENCOUNTER — Ambulatory Visit
Admission: RE | Admit: 2023-12-31 | Discharge: 2023-12-31 | Disposition: A | Discharge status: Home / Self Care | Payer: BC Managed Care – PPO | Source: Ambulatory Visit | Attending: Family Medicine | Admitting: Family Medicine

## 2023-12-31 DIAGNOSIS — Z1231 Encounter for screening mammogram for malignant neoplasm of breast: Secondary | ICD-10-CM | POA: Insufficient documentation
